# Patient Record
Sex: Male | Born: 1965
Health system: Southern US, Community
[De-identification: ages and names within clinical notes are randomized; demographics above are authoritative.]

## PROBLEM LIST (undated history)

## (undated) HISTORY — PX: APPENDECTOMY: SHX54

---

## 2012-04-05 ENCOUNTER — Ambulatory Visit: Payer: BC Managed Care – PPO

## 2012-04-05 ENCOUNTER — Ambulatory Visit (INDEPENDENT_AMBULATORY_CARE_PROVIDER_SITE_OTHER): Payer: BC Managed Care – PPO | Admitting: Family Medicine

## 2012-04-05 VITALS — BP 109/75 | HR 80 | Temp 100.9°F | Resp 20 | Ht 71.0 in | Wt 283.0 lb

## 2012-04-05 DIAGNOSIS — R0609 Other forms of dyspnea: Secondary | ICD-10-CM

## 2012-04-05 DIAGNOSIS — R05 Cough: Secondary | ICD-10-CM

## 2012-04-05 DIAGNOSIS — R51 Headache: Secondary | ICD-10-CM

## 2012-04-05 DIAGNOSIS — R0600 Dyspnea, unspecified: Secondary | ICD-10-CM

## 2012-04-05 DIAGNOSIS — R059 Cough, unspecified: Secondary | ICD-10-CM

## 2012-04-05 DIAGNOSIS — R509 Fever, unspecified: Secondary | ICD-10-CM

## 2012-04-05 DIAGNOSIS — J189 Pneumonia, unspecified organism: Secondary | ICD-10-CM

## 2012-04-05 LAB — POCT CBC
Granulocyte percent: 66.9 %G (ref 37–80)
HCT, POC: 39.9 % — AB (ref 43.5–53.7)
Hemoglobin: 12.2 g/dL — AB (ref 14.1–18.1)
Lymph, poc: 1.4 (ref 0.6–3.4)
MCH, POC: 27.1 pg (ref 27–31.2)
MCHC: 30.6 g/dL — AB (ref 31.8–35.4)
MCV: 88.5 fL (ref 80–97)
MID (cbc): 0.5 (ref 0–0.9)
MPV: 8.2 fL (ref 0–99.8)
POC Granulocyte: 3.9 (ref 2–6.9)
POC LYMPH PERCENT: 24.8 %L (ref 10–50)
POC MID %: 8.3 %M (ref 0–12)
Platelet Count, POC: 245 10*3/uL (ref 142–424)
RBC: 4.51 M/uL — AB (ref 4.69–6.13)
RDW, POC: 15.9 %
WBC: 5.8 10*3/uL (ref 4.6–10.2)

## 2012-04-05 LAB — POCT INFLUENZA A/B
Influenza A, POC: NEGATIVE
Influenza B, POC: NEGATIVE

## 2012-04-05 MED ORDER — LEVOFLOXACIN 500 MG PO TABS: 500.0000 mg | ORAL_TABLET | Freq: Every day | ORAL | Status: DC

## 2012-04-05 MED ORDER — HYDROCODONE-HOMATROPINE 5-1.5 MG/5ML PO SYRP: 5.0000 mL | ORAL_SOLUTION | Freq: Three times a day (TID) | ORAL | Status: DC | PRN

## 2012-04-05 MED ORDER — CEFTRIAXONE SODIUM 1 G IJ SOLR
1.0000 g | INTRAMUSCULAR | Status: DC
  Administered 2012-04-05: 1 g via INTRAMUSCULAR

## 2012-04-05 NOTE — Progress Notes (Addendum)
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  Objective:  NAD, alert HEENT: normal TM's, fundi, oroph, PERLA, EOMI Neck: supple, no adenop or thyromeg Chest:  Coarse BS bilaterally, no rub Heart: no gallop, murmur, rub Abdomen:  Soft, nontender without mass or HSM Ext: pink nail beds, no splinter hemorrhages, no edema;  Pes planus [facility] reading (PRIMARY) by  Dr. Milus Glazier CXR. Lobar infiltrate Results for orders placed in visit on 04/05/12  POCT CBC      Component Value Range   WBC 5.8  4.6 - 10.2 K/uL   Lymph, poc 1.4  0.6 - 3.4   POC LYMPH PERCENT 24.8  10 - 50 %L   MID (cbc) 0.5  0 - 0.9   POC MID % 8.3  0 - 12 %M   POC Granulocyte 3.9  2 - 6.9   Granulocyte percent 66.9  37 - 80 %G   RBC 4.51 (*) 4.69 - 6.13 M/uL   Hemoglobin 12.2 (*) 14.1 - 18.1 g/dL   HCT, POC 16.1 (*) 09.6 - 53.7 %   MCV 88.5  80 - 97 fL   MCH, POC 27.1  27 - 31.2 pg   MCHC 30.6 (*) 31.8 - 35.4 g/dL   RDW, POC 04.5     Platelet Count, POC 245  142 - 424 K/uL   MPV 8.2  0 - 99.8 fL  POCT INFLUENZA A/B      Component Value Range   Influenza A, POC Negative     Influenza B, POC Negative        Assessment:  1. Pneumonia  cefTRIAXone (ROCEPHIN) injection 1 g, levofloxacin (LEVAQUIN) 500 MG tablet, HYDROcodone-homatropine (HYCODAN) 5-1.5 MG/5ML syrup  2. DOE (dyspnea on exertion)  DG Chest 2 View, POCT CBC  3. Cough  DG Chest 2 View, POCT CBC  4. Fever  POCT CBC, POCT Influenza A/B  5. Headache  POCT CBC, POCT Influenza A/B

## 2012-04-05 NOTE — Patient Instructions (Signed)

## 2012-04-07 ENCOUNTER — Ambulatory Visit (INDEPENDENT_AMBULATORY_CARE_PROVIDER_SITE_OTHER): Payer: BC Managed Care – PPO | Admitting: Family Medicine

## 2012-04-07 VITALS — BP 120/73 | HR 70 | Temp 97.9°F | Resp 18 | Ht 71.0 in | Wt 283.0 lb

## 2012-04-07 DIAGNOSIS — J189 Pneumonia, unspecified organism: Secondary | ICD-10-CM

## 2012-04-07 NOTE — Progress Notes (Signed)
46 year old gentleman who is recently been diagnosed with pneumonia. Since he was here he has done much better with better breathing no chest pain and no fever. He's been taking his medicine as directed and has had no side effects such as dizziness or nausea with the antibiotics.  Objective: Patient looks like 1 million box Chest: Few sonorous expiratory wheezes on the right HEENT unremarkable Assessment: Excellent improvement  Plan continue current medications

## 2013-07-25 IMAGING — CR DG CHEST 2V
3 series · 3 of 3 positions shown · non-contrast
Comparison: Preliminary reading of Dr. Ito

CLINICAL DATA: Cough, shortness of breath

CHEST - 2 VIEW

[PA]
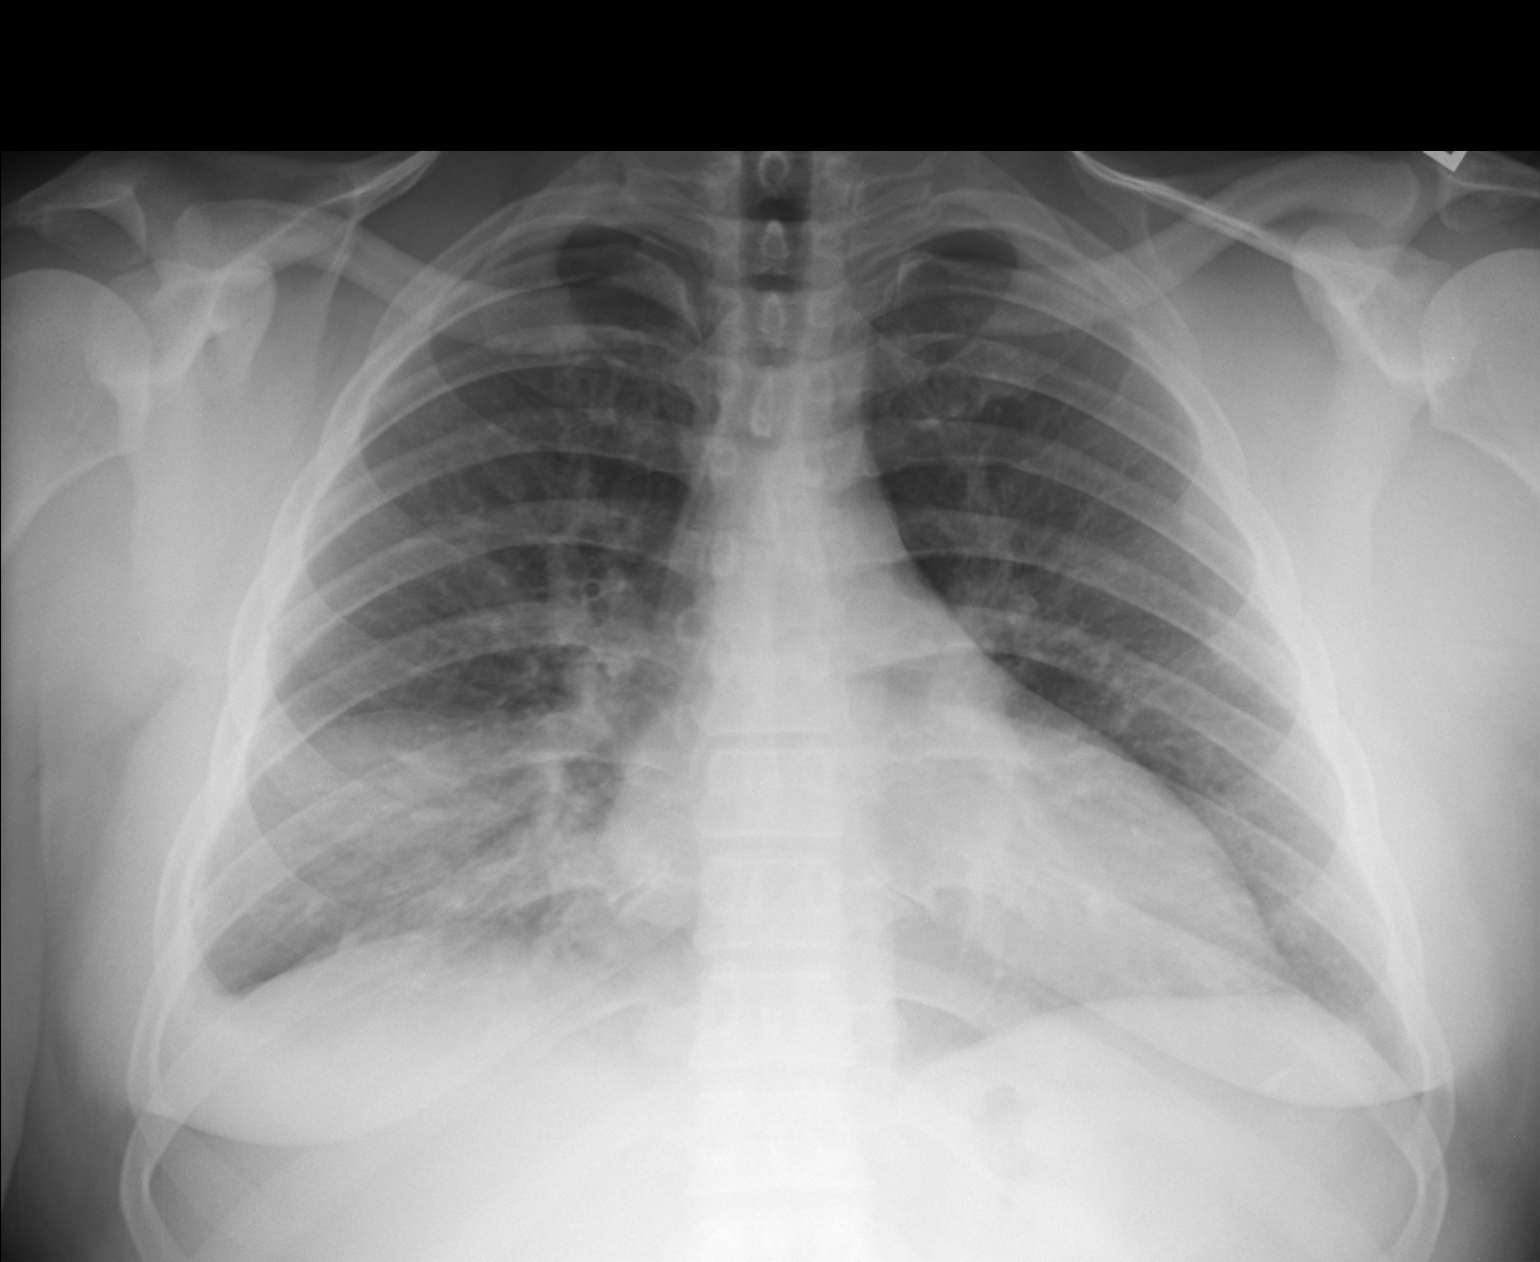

[lateral (1 of 2)]
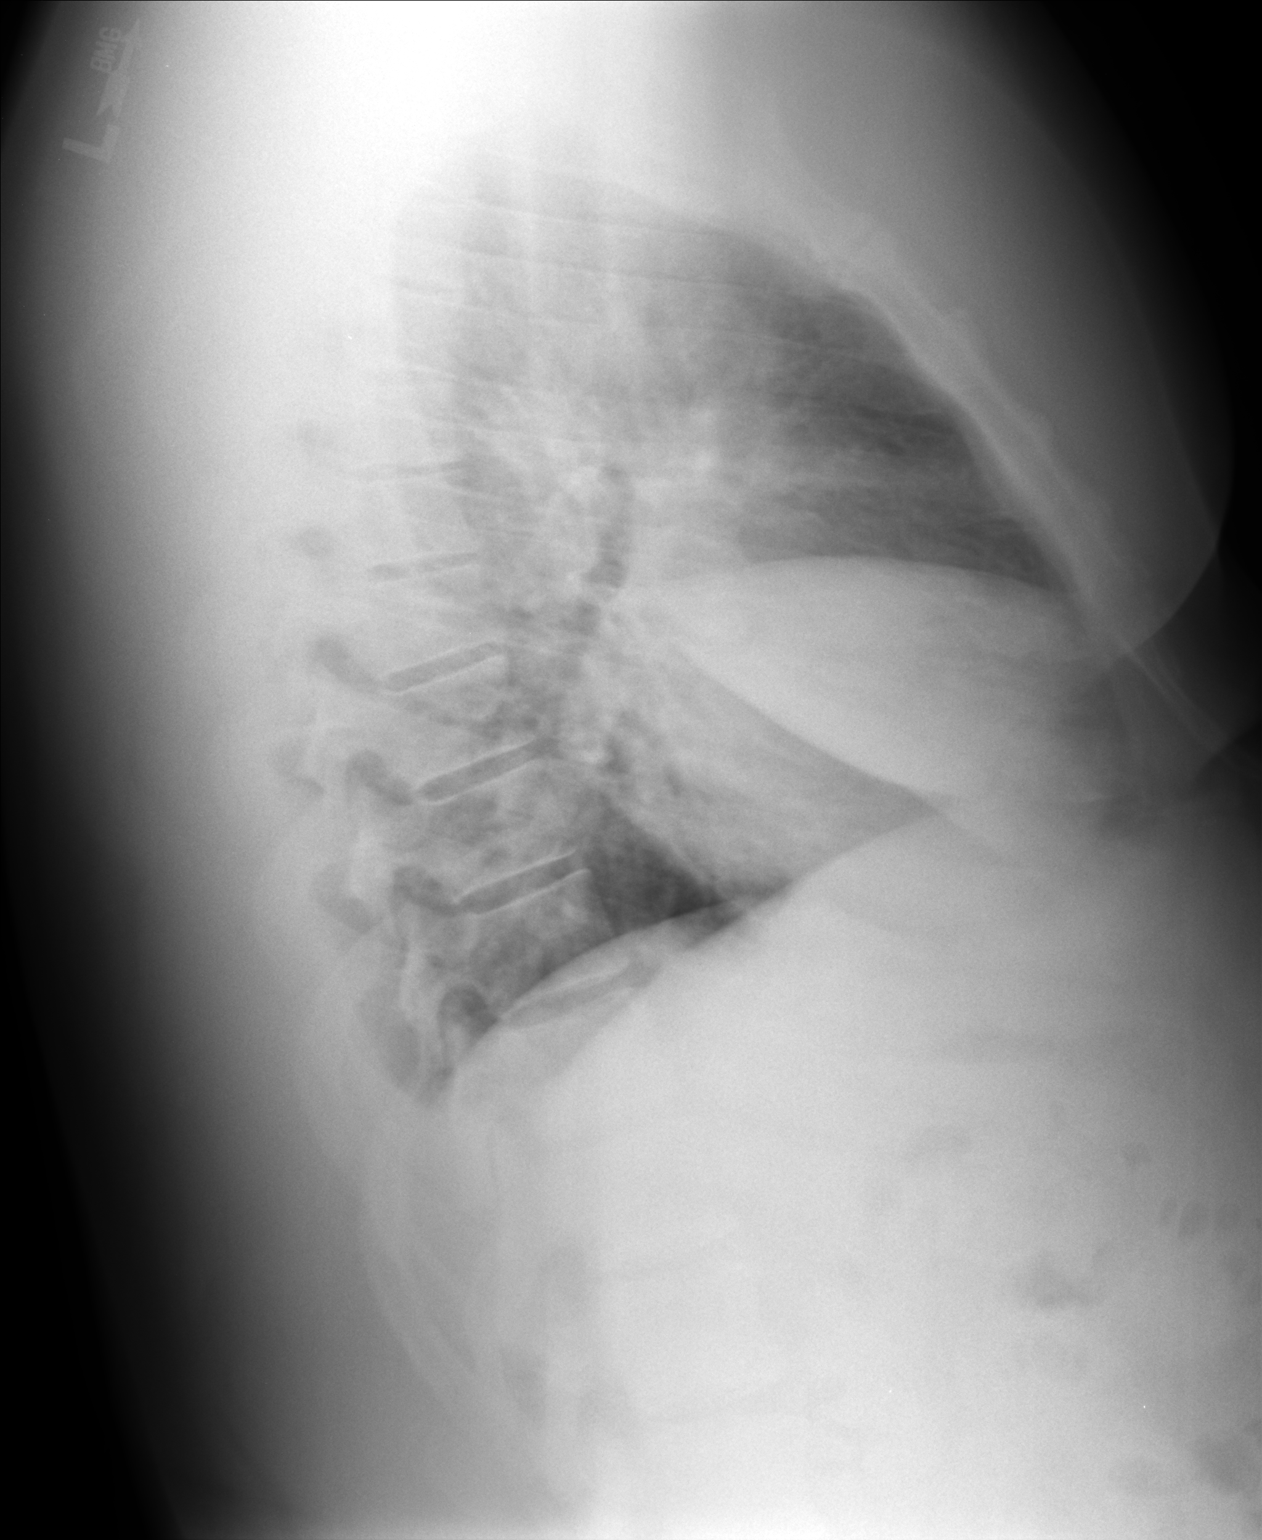

[lateral (2 of 2)]
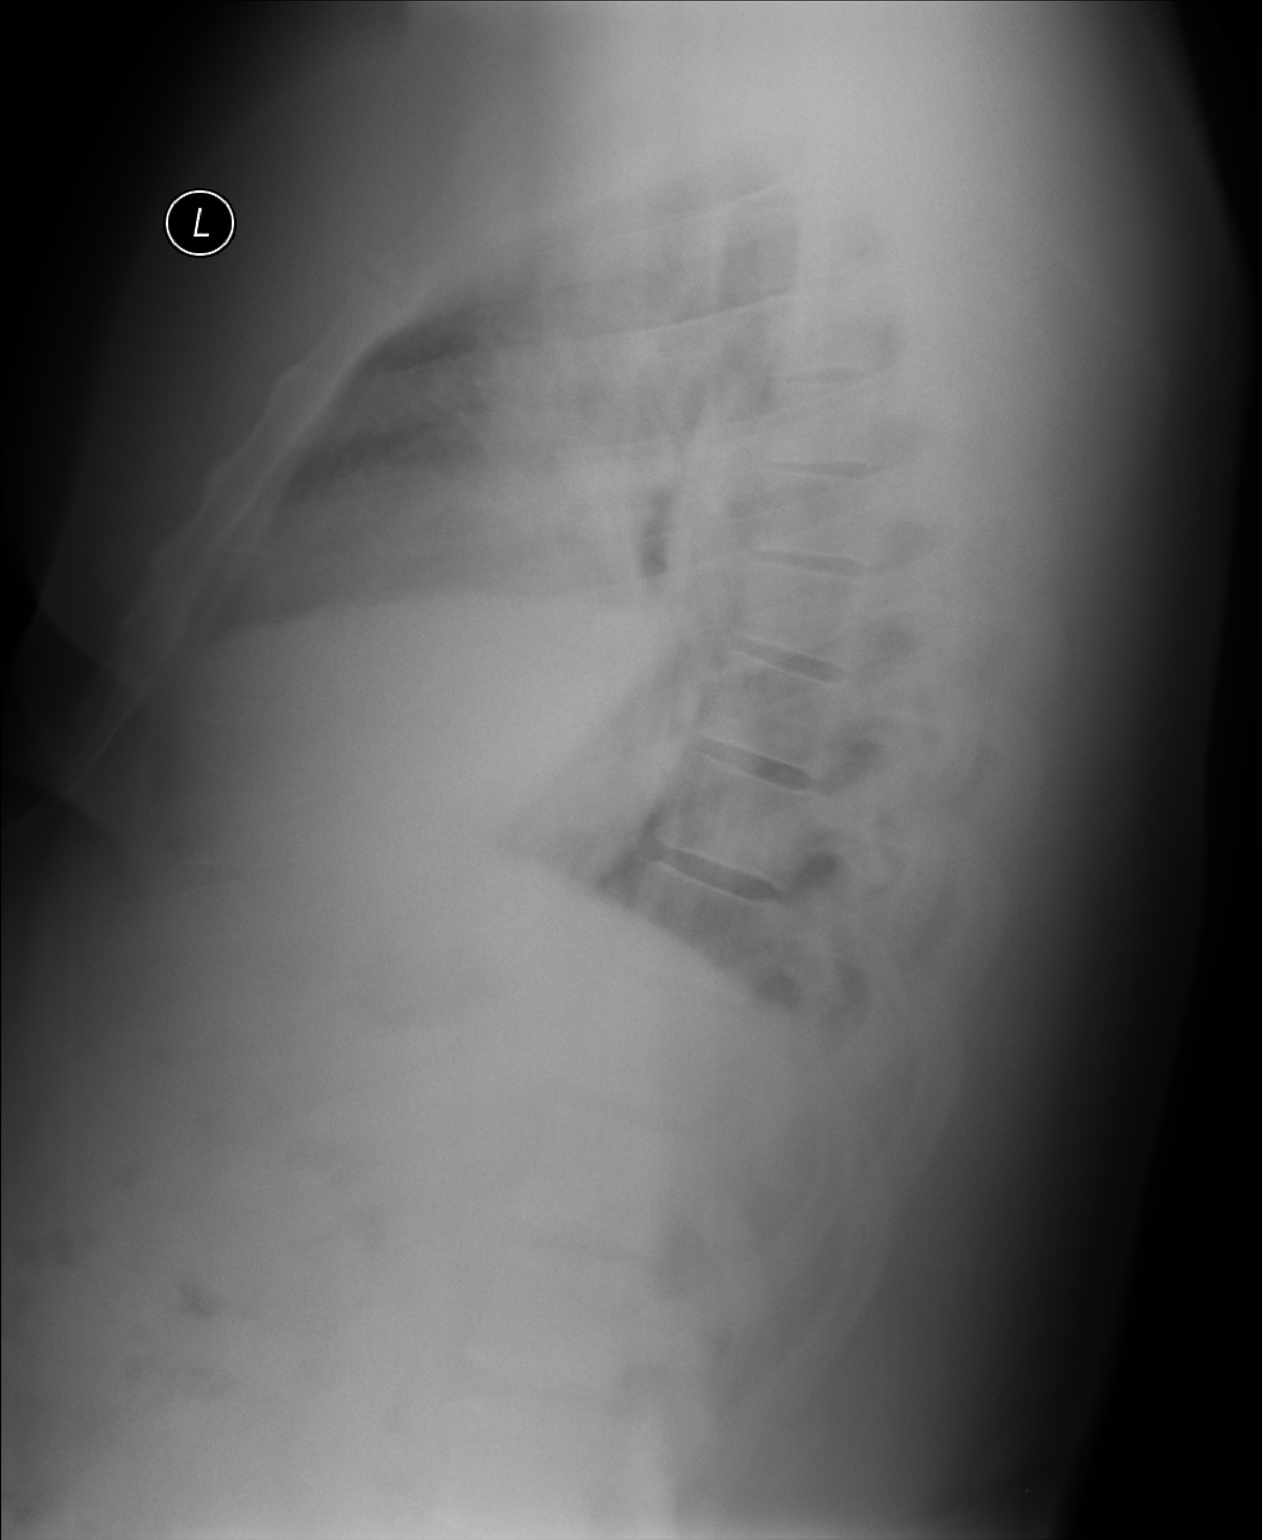

[3 of 3 positions shown; findings below may reference images not displayed]

FINDINGS: Cardiomediastinal silhouette is unremarkable.  There is
hazy infiltrate/pneumonia in the right lower lobe.  Follow-up to
resolution after appropriate treatment is recommended.  Bony thorax
is unremarkable.  No pulmonary edema .
IMPRESSION: Hazy infiltrate/pneumonia in the right lower lobe.  Follow-up to
resolution after treatment is recommended.

## 2015-03-31 ENCOUNTER — Emergency Department
Admission: EM | Admit: 2015-03-31 | Discharge: 2015-03-31 | Disposition: A | Discharge status: Home / Self Care | Payer: Commercial Indemnity | Attending: Emergency Medicine | Admitting: Emergency Medicine

## 2015-03-31 ENCOUNTER — Encounter: Payer: Self-pay | Admitting: Emergency Medicine

## 2015-03-31 DIAGNOSIS — Y9289 Other specified places as the place of occurrence of the external cause: Secondary | ICD-10-CM | POA: Diagnosis not present

## 2015-03-31 DIAGNOSIS — Y998 Other external cause status: Secondary | ICD-10-CM | POA: Diagnosis not present

## 2015-03-31 DIAGNOSIS — S40862A Insect bite (nonvenomous) of left upper arm, initial encounter: Secondary | ICD-10-CM | POA: Diagnosis not present

## 2015-03-31 DIAGNOSIS — W57XXXA Bitten or stung by nonvenomous insect and other nonvenomous arthropods, initial encounter: Secondary | ICD-10-CM | POA: Diagnosis not present

## 2015-03-31 DIAGNOSIS — Z79899 Other long term (current) drug therapy: Secondary | ICD-10-CM | POA: Insufficient documentation

## 2015-03-31 DIAGNOSIS — Y9389 Activity, other specified: Secondary | ICD-10-CM | POA: Insufficient documentation

## 2015-03-31 MED ORDER — DEXAMETHASONE SODIUM PHOSPHATE 10 MG/ML IJ SOLN
10.0000 mg | Freq: Once | INTRAMUSCULAR | Status: AC
  Administered 2015-03-31: 10 mg via INTRAMUSCULAR
  Filled 2015-03-31: qty 1

## 2015-03-31 MED ORDER — CYPROHEPTADINE HCL 4 MG PO TABS: 4.0000 mg | ORAL_TABLET | Freq: Three times a day (TID) | ORAL | Status: DC | PRN

## 2015-03-31 MED ORDER — METHYLPREDNISOLONE 4 MG PO TBPK
ORAL_TABLET | ORAL | Status: DC
Start: 2015-03-31 — End: 2018-02-14

## 2015-03-31 MED ORDER — HYDROXYZINE HCL 50 MG PO TABS
50.0000 mg | ORAL_TABLET | Freq: Once | ORAL | Status: AC
  Administered 2015-03-31: 50 mg via ORAL
  Filled 2015-03-31: qty 1

## 2015-03-31 NOTE — ED Notes (Signed)
Pt. Going home by self. 

## 2015-03-31 NOTE — ED Notes (Signed)
Pt presents to ER with c/o of doing outside work around 11 am and developed small cyst-like insect bites from lefty wrist to left upper warm. Warm to touch, erythematous, and hard.

## 2015-03-31 NOTE — ED Provider Notes (Signed)
[facility] Emergency Department Provider Note  ____________________________________________  Time seen: Approximately 7:45 PM  I have reviewed the triage vital signs and the nursing notes.   HISTORY  Chief Complaint Insect Bite    HPI Samuel Ellis is a 49 y.o. male patient complain of itching and swelling to the to left upper arm secondary to insect bite. 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 Instead occurred approximately 1100 hrs. today. Patient states continue working and after work he washed the area area and noticed to papular lesions at the site of itching. Patient also noticed some mild erythema. Patient denies any fever or chills he denies any nausea vomiting. Patient states there is no pain just itching and mild rash.   History reviewed. No pertinent past medical history.  There are no active problems to display for this patient.   Past Surgical History  Procedure Laterality Date  . Appendectomy      Current Outpatient Rx  Name  Route  Sig  Dispense  Refill  . cyproheptadine (PERIACTIN) 4 MG tablet   Oral   Take 1 tablet (4 mg total) by mouth 3 (three) times daily as needed for allergies.   30 tablet   0   . HYDROcodone-homatropine (HYCODAN) 5-1.5 MG/5ML syrup   Oral   Take 5 mLs by mouth every 8 (eight) hours as needed for cough.   120 mL   0   . levofloxacin (LEVAQUIN) 500 MG tablet   Oral   Take 1 tablet (500 mg total) by mouth daily.   7 tablet   0   . methylPREDNISolone (MEDROL DOSEPAK) 4 MG TBPK tablet      Take Tapered dose as directed   21 tablet   0     Allergies Shellfish allergy  Family History  Problem Relation Age of Onset  . Diabetes Mother   . Hypertension Maternal Grandmother   . Diabetes Maternal Grandfather     Social History Social History  Substance Use Topics  . Smoking status: Never Smoker   . Smokeless tobacco: None  . Alcohol Use: None    Review of  Systems Constitutional: No fever/chills Eyes: No visual changes. ENT: No sore throat. Cardiovascular: Denies chest pain. Respiratory: Denies shortness of breath. Gastrointestinal: No abdominal pain.  No nausea, no vomiting.  No diarrhea.  No constipation. Genitourinary: Negative for dysuria. Musculoskeletal: Negative for back pain. Skin: Negative for rash. Neurological: Negative for headaches, focal weakness or numbness. Allergic/Immunilogical: Shellfish  10-point ROS otherwise negative.  ____________________________________________   PHYSICAL EXAM:  VITAL SIGNS: ED Triage Vitals  Enc Vitals Group     BP 03/31/15 1931 134/76 mmHg     Pulse Rate 03/31/15 1931 79     Resp 03/31/15 1931 20     Temp 03/31/15 1931 98.3 F (36.8 C)     Temp Source 03/31/15 1931 Oral     SpO2 03/31/15 1931 96 %     Weight 03/31/15 1931 296 lb (134.265 kg)     Height 03/31/15 1931  (1.803 m)     Head Cir --      Peak Flow --      Pain Score --      Pain Loc --      Pain Edu? --      Excl. in GC? --     Constitutional: Alert and oriented. Well appearing and in no acute distress. Eyes: Conjunctivae are normal. PERRL. EOMI. Head: Atraumatic. Nose: No  congestion/rhinnorhea. Mouth/Throat: Mucous membranes are moist.  Oropharynx non-erythematous. Neck: No stridor.  No cervical spine tenderness to palpation. Hematological/Lymphatic/Immunilogical: No cervical lymphadenopathy. Cardiovascular: Normal rate, regular rhythm. Grossly normal heart sounds.  Good peripheral circulation. Respiratory: Normal respiratory effort.  No retractions. Lungs CTAB. Gastrointestinal: Soft and nontender. No distention. No abdominal bruits. No CVA tenderness. Musculoskeletal: No lower extremity tenderness nor edema.  No joint effusions. Neurologic:  Normal speech and language. No gross focal neurologic deficits are appreciated. No gait instability. Skin:  Skin is warm, dry and intact. No rash noted. 2 small  papular lesions medial aspect of the left upper arm. Mild edema and erythema. Psychiatric: Mood and affect are normal. Speech and behavior are normal.  ____________________________________________   LABS (all labs ordered are listed, but only abnormal results are displayed)  Labs Reviewed - No data to display ____________________________________________  EKG   ____________________________________________  RADIOLOGY   ____________________________________________   PROCEDURES  Procedure(s) performed: None  Critical Care performed: No  ____________________________________________   INITIAL IMPRESSION / ASSESSMENT AND PLAN / ED COURSE  Pertinent labs & imaging results that were available during my care of the patient were reviewed by me and considered in my medical decision making (see chart for details).  Localized reaction to insect bite. Patient given Decadron and Atarax in the ER. Patient discharged with prescription for prednisone and Periactin. Patient advised [facility] or return to ER if his condition worsens. ____________________________________________   FINAL CLINICAL IMPRESSION(S) / ED DIAGNOSES  Final diagnoses:  Insect bite of left arm, initial encounter      Joni ReiningRonald K Smith, PA-C 03/31/15 1950  Sharman CheekPhillip Stafford, MD 04/01/15 2202

## 2015-03-31 NOTE — Discharge Instructions (Signed)
Insect Bite °Mosquitoes, flies, fleas, bedbugs, and other insects can bite. Insect bites are different from insect stings. The bite may be red, puffy (swollen), and itchy for 2 to 4 days. Most bites get better on their own. °HOME CARE  °· Do not scratch the bite. °· Keep the bite clean and dry. Wash the bite with soap and water every day, as told by your doctor. °· If directed, apply ice to the bite area. °¨ Put ice in a plastic bag. °¨ Place a towel between your skin and the bag. °¨ Leave the ice on for 20 minutes, 2-3 times per day. °· Follow instructions from your doctor about using medicated lotions or creams. These can help with itching. °· Apply or take over-the-counter and prescription medicines only as told by your doctor. °· If you were given an antibiotic medicine, use it as told by your doctor. Do not stop using the medicine even if your condition improves. °· Keep all follow-up visits as told by your doctor. This is important. °GET HELP IF: °· You have redness, swelling (inflammation), or pain near your bite that is getting worse. °· You have a fever. °GET HELP RIGHT AWAY IF:  °· You have joint pain.   °· You have fluid, blood, or pus coming from the bite area.   °· You have a headache. °· You have neck pain. °· You feel weaker than you normally do.   °· You have a rash.   °· You have chest pain. °· You have shortness of breath. °· You have stomach pain, feel sick to your stomach (nauseous), or throw up (vomit). °· You feel more tired or sleepy than you normally do. °  °This information is not intended to replace advice given to you by your health care provider. Make sure you discuss any questions you have with your health care provider. °  °Document Released: 05/14/2000 Document Revised: 02/05/2015 Document Reviewed: 10/02/2014 °Elsevier Interactive Patient Education ©2016 Elsevier Inc. ° °

## 2015-03-31 NOTE — ED Notes (Signed)
Pt reports he felt a burning on the interior aspect of his left arm.  Pt reports the injury 'wept', and as time went on, it became reddened and hotter.  Pt took a benadryl at approximately 17:30. Pt has two small punctures on interior aspect of left arm.  Redness and edema from mid forearm above elbow.

## 2017-06-29 DIAGNOSIS — J069 Acute upper respiratory infection, unspecified: Secondary | ICD-10-CM | POA: Diagnosis not present

## 2017-06-29 DIAGNOSIS — R6889 Other general symptoms and signs: Secondary | ICD-10-CM | POA: Diagnosis not present

## 2017-06-29 DIAGNOSIS — J019 Acute sinusitis, unspecified: Secondary | ICD-10-CM | POA: Diagnosis not present

## 2018-02-14 ENCOUNTER — Ambulatory Visit: Payer: 59 | Admitting: Family Medicine

## 2018-02-14 ENCOUNTER — Other Ambulatory Visit: Payer: Self-pay

## 2018-02-14 ENCOUNTER — Encounter: Payer: Self-pay | Admitting: Family Medicine

## 2018-02-14 VITALS — BP 138/85 | HR 60 | Temp 98.8°F | Ht 71.0 in | Wt 308.4 lb

## 2018-02-14 DIAGNOSIS — J069 Acute upper respiratory infection, unspecified: Secondary | ICD-10-CM

## 2018-02-14 DIAGNOSIS — Z1211 Encounter for screening for malignant neoplasm of colon: Secondary | ICD-10-CM | POA: Diagnosis not present

## 2018-02-14 NOTE — Progress Notes (Signed)
9/17/20192:35 PM  Samuel JacobsonRoderick Bagby [birth year], 52 y.o. male [id number]  Chief Complaint  Patient presents with  . Nasal Congestion    onset Saturday, taking mucinex for the for the symptoms.     HPI:   Patient is a 52 y.o. male who presents today for congestion  4 days of nasal and chest congestion started with runny nose and low grade fever mucinex and night quil, helping with cough, minimal production, slight color to it No more fever, no SOB Daughter and mother had colds, but nothing more than it  Non smoker No h/o asthma or COPD No sign seasonal allergies Has no PCP  No fhx colon cancer screening Flu vaccine with CPE  Fall Risk  02/14/2018  Falls in the past year? No     Depression screen PHQ 2/9 02/14/2018  Decreased Interest 0  Down, Depressed, Hopeless 0  PHQ - 2 Score 0    Allergies  Allergen Reactions  . Shellfish Allergy Anaphylaxis    Throat swells.    Prior to Admission medications   Medication Sig Start Date End Date Taking? Authorizing Provider  cyproheptadine (PERIACTIN) 4 MG tablet Take 1 tablet (4 mg total) by mouth 3 (three) times daily as needed for allergies. 03/31/15   Joni ReiningSmith, Ronald K, PA-C  HYDROcodone-homatropine [facility](HYCODAN) 5-1.5 MG/5ML syrup Take 5 mLs by mouth every 8 (eight) hours as needed for cough. 04/05/12   Elvina SidleLauenstein, Kurt, MD  levofloxacin (LEVAQUIN) 500 MG tablet Take 1 tablet (500 mg total) by mouth daily. 04/05/12   Elvina SidleLauenstein, Kurt, MD  methylPREDNISolone (MEDROL DOSEPAK) 4 MG TBPK tablet Take Tapered dose as directed 03/31/15   Joni ReiningSmith, Ronald K, PA-C    History reviewed. No pertinent past medical history.  Past Surgical History:  Procedure Laterality Date  . APPENDECTOMY      Social History   Tobacco Use  . Smoking status: Never Smoker  . Smokeless tobacco: Never Used  Substance Use Topics  . Alcohol use: Yes    Family History  Problem Relation Age of Onset  . Diabetes Mother   . Hypertension Maternal Grandmother    . Diabetes Maternal Grandfather     Review of Systems  Constitutional: Positive for fever. Negative for chills and malaise/fatigue.  Respiratory: Positive for cough and sputum production. Negative for hemoptysis, shortness of breath and wheezing.   Cardiovascular: Negative for chest pain, palpitations and leg swelling.  Gastrointestinal: Negative for abdominal pain, nausea and vomiting.     OBJECTIVE:  Blood pressure 138/85, pulse 60, temperature 98.8 F (37.1 C), temperature source Oral, height 5\' 11"  (1.803 m), weight (!) 308 lb 6.4 oz (139.9 kg), SpO2 98 %. Body mass index is 43.01 kg/m.   Physical Exam  Constitutional: He is oriented to person, place, and time. He appears well-developed and well-nourished.  HENT:  Head: Normocephalic and atraumatic.  Right Ear: Hearing, tympanic membrane, external ear and ear canal normal.  Left Ear: Hearing, tympanic membrane, external ear and ear canal normal.  Mouth/Throat: Oropharynx is clear and moist. No oropharyngeal exudate.  Eyes: Pupils are equal, round, and reactive to light. Conjunctivae and EOM are normal.  Neck: Neck supple.  Cardiovascular: Normal rate and regular rhythm. Exam reveals no gallop and no friction rub.  No murmur heard. Pulmonary/Chest: Effort normal and breath sounds normal. He has no wheezes. He has no rales.  Musculoskeletal: He exhibits no edema.  Lymphadenopathy:    He has no cervical adenopathy.  Neurological: He is alert and oriented to person,  place, and time.  Skin: Skin is warm and dry.  Psychiatric: He has a normal mood and affect.  Nursing note and vitals reviewed.   ASSESSMENT and PLAN  1. URI, acute Discussed supportive measures for URI: increase hydration, rest, OTC medications, etc. RTC precautions discussed.  2. Colon cancer screening - Cologuard  Return for CPE.    Myles Lipps, MD Primary Care at [facility] [address] [place], [place] [address] Ph.  [phone] Fax  [phone]

## 2018-02-14 NOTE — Patient Instructions (Addendum)
   If you have lab work done today you will be contacted with your lab results within the next 2 weeks.  If you have not heard from us then please contact us. The fastest way to get your results is to register for My Chart.   IF you received an x-ray today, you will receive an invoice from [facility]. Please contact [facility] at [phone] with questions or concerns regarding your invoice.   IF you received labwork today, you will receive an invoice from [facility]. Please contact [facility] at [phone] with questions or concerns regarding your invoice.   Our billing staff will not be able to assist you with questions regarding bills from these companies.  You will be contacted with the lab results as soon as they are available. The fastest way to get your results is to activate your My Chart account. Instructions are located on the last page of this paperwork. If you have not heard from us regarding the results in 2 weeks, please contact this office.     Upper Respiratory Infection, Adult Most upper respiratory infections (URIs) are caused by a virus. A URI affects the nose, throat, and upper air passages. The most common type of URI is often called "the common cold." Follow these instructions at home:  Take medicines only as told by your doctor.  Gargle warm saltwater or take cough drops to comfort your throat as told by your doctor.  Use a warm mist humidifier or inhale steam from a shower to increase air moisture. This may make it easier to breathe.  Drink enough fluid to keep your pee (urine) clear or pale yellow.  Eat soups and other clear broths.  Have a healthy diet.  Rest as needed.  Go back to work when your fever is gone or your doctor says it is okay. ? You may need to stay home longer to avoid giving your URI to others. ? You can also wear a face mask and wash your hands often to prevent spread of the virus.  Use your inhaler more if you  have asthma.  Do not use any tobacco products, including cigarettes, chewing tobacco, or electronic cigarettes. If you need help quitting, ask your doctor. Contact a doctor if:  You are getting worse, not better.  Your symptoms are not helped by medicine.  You have chills.  You are getting more short of breath.  You have brown or red mucus.  You have yellow or brown discharge from your nose.  You have pain in your face, especially when you bend forward.  You have a fever.  You have puffy (swollen) neck glands.  You have pain while swallowing.  You have white areas in the back of your throat. Get help right away if:  You have very bad or constant: ? Headache. ? Ear pain. ? Pain in your forehead, behind your eyes, and over your cheekbones (sinus pain). ? Chest pain.  You have long-lasting (chronic) lung disease and any of the following: ? Wheezing. ? Long-lasting cough. ? Coughing up blood. ? A change in your usual mucus.  You have a stiff neck.  You have changes in your: ? Vision. ? Hearing. ? Thinking. ? Mood. This information is not intended to replace advice given to you by your health care provider. Make sure you discuss any questions you have with your health care provider. Document Released: 11/03/2007 Document Revised: 01/18/2016 Document Reviewed: 08/22/2013 Elsevier Interactive Patient Education  2018 Elsevier   Inc.  

## 2018-03-14 ENCOUNTER — Encounter: Payer: Self-pay | Admitting: Family Medicine

## 2018-03-14 ENCOUNTER — Other Ambulatory Visit: Payer: Self-pay

## 2018-03-14 ENCOUNTER — Ambulatory Visit (INDEPENDENT_AMBULATORY_CARE_PROVIDER_SITE_OTHER): Payer: 59 | Admitting: Family Medicine

## 2018-03-14 VITALS — BP 122/84 | HR 60 | Temp 97.3°F | Ht 71.0 in | Wt 309.4 lb

## 2018-03-14 DIAGNOSIS — Z13228 Encounter for screening for other metabolic disorders: Secondary | ICD-10-CM

## 2018-03-14 DIAGNOSIS — Z125 Encounter for screening for malignant neoplasm of prostate: Secondary | ICD-10-CM

## 2018-03-14 DIAGNOSIS — Z0000 Encounter for general adult medical examination without abnormal findings: Secondary | ICD-10-CM | POA: Diagnosis not present

## 2018-03-14 DIAGNOSIS — Z13220 Encounter for screening for lipoid disorders: Secondary | ICD-10-CM

## 2018-03-14 DIAGNOSIS — Z23 Encounter for immunization: Secondary | ICD-10-CM

## 2018-03-14 DIAGNOSIS — Z1211 Encounter for screening for malignant neoplasm of colon: Secondary | ICD-10-CM | POA: Diagnosis not present

## 2018-03-14 DIAGNOSIS — Z1329 Encounter for screening for other suspected endocrine disorder: Secondary | ICD-10-CM | POA: Diagnosis not present

## 2018-03-14 DIAGNOSIS — Z130 Encounter for screening for diseases of the blood and blood-forming organs and certain disorders involving the immune mechanism: Secondary | ICD-10-CM

## 2018-03-14 NOTE — Patient Instructions (Addendum)
If you have lab work done today you will be contacted with your lab results within the next 2 weeks.  If you have not heard from [place] then please contact us. The fastest way to get your results is to register for My Chart.   IF you received an x-ray today, you will receive an invoice from [facility]. Please contact [facility] at [phone] with questions or concerns regarding your invoice.   IF you received labwork today, you will receive an invoice from [place]. Please contact [facility] at [phone] with questions or concerns regarding your invoice.   Our billing staff will not be able to assist you with questions regarding bills from these companies.  You will be contacted with the lab results as soon as they are available. The fastest way to get your results is to activate your My Chart account. Instructions are located on the last page of this paperwork. If you have not heard from [place] regarding the results in 2 weeks, please contact this office.        If you have lab work done today you will be contacted with your lab results within the next 2 weeks.  If you have not heard from [place] then please contact us. The fastest way to get your results is to register for My Chart.   IF you received an x-ray today, you will receive an invoice from [facility]. Please contact [facility] at [phone] with questions or concerns regarding your invoice.   IF you received labwork today, you will receive an invoice from [place]. Please contact [facility] at [phone] with questions or concerns regarding your invoice.   Our billing staff will not be able to assist you with questions regarding bills from these companies.  You will be contacted with the lab results as soon as they are available. The fastest way to get your results is to activate your My Chart account. Instructions are located on the last page of this paperwork. If you have not heard from [place]  regarding the results in 2 weeks, please contact this office.     Preventive Care 40-64 Years, Male Preventive care refers to lifestyle choices and visits with your health care provider that can promote health and wellness. What does preventive care include?  A yearly physical exam. This is also called an annual well check.  Dental exams once or twice a year.  Routine eye exams. Ask your health care provider how often you should have your eyes checked.  Personal lifestyle choices, including: ? Daily care of your teeth and gums. ? Regular physical activity. ? Eating a healthy diet. ? Avoiding tobacco and drug use. ? Limiting alcohol use. ? Practicing safe sex. ? Taking low-dose aspirin every day starting at age 9. What happens during an annual well check? The services and screenings done by your health care provider during your annual well check will depend on your age, overall health, lifestyle risk factors, and family history of disease. Counseling Your health care provider may ask you questions about your:  Alcohol use.  Tobacco use.  Drug use.  Emotional well-being.  Home and relationship well-being.  Sexual activity.  Eating habits.  Work and work Statistician.  Screening You may have the following tests or measurements:  Height, weight, and BMI.  Blood pressure.  Lipid and cholesterol levels. These may be checked every 5 years, or more frequently if you are over 30 years old.  Skin check.  Lung cancer  screening. You may have this screening every year starting at age 96 if you have a 30-pack-year history of smoking and currently smoke or have quit within the past 15 years.  Fecal occult blood test (FOBT) of the stool. You may have this test every year starting at age 59.  Flexible sigmoidoscopy or colonoscopy. You may have a sigmoidoscopy every 5 years or a colonoscopy every 10 years starting at age 67.  Prostate cancer screening. Recommendations will  vary depending on your family history and other risks.  Hepatitis C blood test.  Hepatitis B blood test.  Sexually transmitted disease (STD) testing.  Diabetes screening. This is done by checking your blood sugar (glucose) after you have not eaten for a while (fasting). You may have this done every 1-3 years.  Discuss your test results, treatment options, and if necessary, the need for more tests with your health care provider. Vaccines Your health care provider may recommend certain vaccines, such as:  Influenza vaccine. This is recommended every year.  Tetanus, diphtheria, and acellular pertussis (Tdap, Td) vaccine. You may need a Td booster every 10 years.  Varicella vaccine. You may need this if you have not been vaccinated.  Zoster vaccine. You may need this after age 21.  Measles, mumps, and rubella (MMR) vaccine. You may need at least one dose of MMR if you were born in 1957 or later. You may also need a second dose.  Pneumococcal 13-valent conjugate (PCV13) vaccine. You may need this if you have certain conditions and have not been vaccinated.  Pneumococcal polysaccharide (PPSV23) vaccine. You may need one or two doses if you smoke cigarettes or if you have certain conditions.  Meningococcal vaccine. You may need this if you have certain conditions.  Hepatitis A vaccine. You may need this if you have certain conditions or if you travel or work in places where you may be exposed to hepatitis A.  Hepatitis B vaccine. You may need this if you have certain conditions or if you travel or work in places where you may be exposed to hepatitis B.  Haemophilus influenzae type b (Hib) vaccine. You may need this if you have certain risk factors.  Talk to your health care provider about which screenings and vaccines you need and how often you need them. This information is not intended to replace advice given to you by your health care provider. Make sure you discuss any questions you  have with your health care provider. Document Released: 06/13/2015 Document Revised: 02/04/2016 Document Reviewed: 03/18/2015 Elsevier Interactive Patient Education  [employer].

## 2018-03-14 NOTE — Progress Notes (Signed)
10/15/20199:31 AM  Samuel Ellis [birth year], 51 y.o. male [id number]  Chief Complaint  Patient presents with  . Annual Exam    HPI:   Patient is a 52 y.o. male who presents today for CPE  Has not had CPE in years He is fasting today Colorectal Cancer Screening: has received cologuard Prostate Cancer Screening: today HIV Screening: declines Seasonal Influenza Vaccination:  Td/Tdap Vaccination: declines Pneumococcal Vaccination: at age 89 Zoster Vaccination: declines Frequency of Dental evaluation: Q6 months Frequency of Eye evaluation: yearly  Exercises about 3-5 x week Diet can be improved Had lost about 70lbs in the past 2 years  Married Works for [employer] services  Fall Risk  03/14/2018 02/14/2018  Falls in the past year? No No     Depression screen [facility] 2/9 03/14/2018 02/14/2018  Decreased Interest 0 0  Down, Depressed, Hopeless 0 0  PHQ - 2 Score 0 0    Allergies  Allergen Reactions  . Shellfish Allergy Anaphylaxis    Throat swells.    Prior to Admission medications   Not on File    History reviewed. No pertinent past medical history.  Past Surgical History:  Procedure Laterality Date  . APPENDECTOMY      Social History   Tobacco Use  . Smoking status: Never Smoker  . Smokeless tobacco: Never Used  Substance Use Topics  . Alcohol use: Yes    Family History  Problem Relation Age of Onset  . Diabetes Mother   . Hypertension Maternal Grandmother   . Diabetes Maternal Grandfather     Review of Systems  Constitutional: Negative for chills, fever and malaise/fatigue.  HENT: Negative for hearing loss and tinnitus.   Eyes: Negative for blurred vision and double vision.  Respiratory: Negative for cough and shortness of breath.   Cardiovascular: Negative for chest pain, palpitations and leg swelling.  Gastrointestinal: Negative for abdominal pain, nausea and vomiting.  Genitourinary: Negative for frequency and urgency.  Musculoskeletal:  Negative for joint pain and myalgias.  Neurological: Negative for dizziness, tingling and headaches.  Endo/Heme/Allergies: Negative for polydipsia.  Psychiatric/Behavioral: Negative for depression. The patient is not nervous/anxious and does not have insomnia.   All other systems reviewed and are negative.    OBJECTIVE:  Blood pressure 122/84, pulse 60, temperature (!) 97.3 F (36.3 C), temperature source Oral, height '5\' 11"'  (1.803 m), weight (!) 309 lb 6.4 oz (140.3 kg), SpO2 97 %. Body mass index is 43.15 kg/m.    Visual Acuity Screening   Right eye Left eye Both eyes  Without correction: '20/25 20/25 20/25 '  With correction:       BP Readings from Last 3 Encounters:  03/14/18 122/84  02/14/18 138/85  03/31/15 130/72   Wt Readings from Last 3 Encounters:  03/14/18 (!) 309 lb 6.4 oz (140.3 kg)  02/14/18 (!) 308 lb 6.4 oz (139.9 kg)  03/31/15 296 lb (134.3 kg)    Physical Exam  Constitutional: He is oriented to person, place, and time. He appears well-developed and well-nourished.  HENT:  Head: Normocephalic and atraumatic.  Right Ear: Hearing, tympanic membrane, external ear and ear canal normal.  Left Ear: Hearing, tympanic membrane, external ear and ear canal normal.  Mouth/Throat: Oropharynx is clear and moist. No oropharyngeal exudate.  Eyes: Pupils are equal, round, and reactive to light. Conjunctivae and EOM are normal.  Neck: Neck supple. No thyromegaly present.  Cardiovascular: Normal rate, regular rhythm, normal heart sounds and intact distal pulses. Exam reveals no gallop and no friction  rub.  No murmur heard. Pulmonary/Chest: Effort normal and breath sounds normal. He has no wheezes. He has no rhonchi. He has no rales.  Abdominal: Soft. Bowel sounds are normal. He exhibits no distension and no mass. There is no tenderness.  Musculoskeletal: Normal range of motion. He exhibits no edema.  Lymphadenopathy:    He has no cervical adenopathy.  Neurological: He is  alert and oriented to person, place, and time. He has normal strength and normal reflexes. No cranial nerve deficit. Coordination and gait normal.  Skin: Skin is warm and dry.  Psychiatric: He has a normal mood and affect.  Nursing note and vitals reviewed.   ASSESSMENT and PLAN 1. Annual physical exam Routine HCM labs ordered. HCM reviewed/discussed. Anticipatory guidance regarding healthy weight, lifestyle and choices given.  - Care order/instruction:  2. Screening for lipid disorders - Lipid panel  3. Screening for metabolic disorder - [id number]  4. Screening for thyroid disorder - TSH  5. Screening for deficiency anemia - CBC with Differential/Platelet  6. Screening for prostate cancer - PSA  Other orders - Flu Vaccine QUAD 36+ mos IM - Td vaccine greater than or equal to 7yo preservative free IM  Return in about 1 year (around 03/15/2019) for CPE.    Rutherford Guys, MD [facility] [place], [place] [address] Ph.  [phone] Fax [phone]

## 2018-03-15 LAB — PSA: Prostate Specific Ag, Serum: 0.9 ng/mL (ref 0.0–4.0)

## 2018-03-15 LAB — LIPID PANEL
Chol/HDL Ratio: 3.6 ratio (ref 0.0–5.0)
Cholesterol, Total: 206 mg/dL — ABNORMAL HIGH (ref 100–199)
HDL: 58 mg/dL (ref >39)
LDL Calculated: 126 mg/dL — ABNORMAL HIGH (ref 0–99)
Triglycerides: 111 mg/dL (ref 0–149)
VLDL Cholesterol Cal: 22 mg/dL (ref 5–40)

## 2018-03-15 LAB — CBC WITH DIFFERENTIAL/PLATELET
Basophils Absolute: 0 10*3/uL (ref 0.0–0.2)
Basos: 1 %
EOS (ABSOLUTE): 0.1 10*3/uL (ref 0.0–0.4)
Eos: 1 %
Hematocrit: 41.5 % (ref 37.5–51.0)
Hemoglobin: 13.4 g/dL (ref 13.0–17.7)
Immature Grans (Abs): 0 10*3/uL (ref 0.0–0.1)
Immature Granulocytes: 0 %
Lymphocytes Absolute: 1.6 10*3/uL (ref 0.7–3.1)
Lymphs: 27 %
MCH: 27.4 pg (ref 26.6–33.0)
MCHC: 32.3 g/dL (ref 31.5–35.7)
MCV: 85 fL (ref 79–97)
Monocytes Absolute: 0.5 10*3/uL (ref 0.1–0.9)
Monocytes: 8 %
Neutrophils Absolute: 3.8 10*3/uL (ref 1.4–7.0)
Neutrophils: 63 %
Platelets: 278 10*3/uL (ref 150–450)
RBC: 4.89 x10E6/uL (ref 4.14–5.80)
RDW: 14.5 % (ref 12.3–15.4)
WBC: 6 10*3/uL (ref 3.4–10.8)

## 2018-03-15 LAB — CMP14+EGFR
ALT: 19 IU/L (ref 0–44)
AST: 18 IU/L (ref 0–40)
Albumin/Globulin Ratio: 1.4 (ref 1.2–2.2)
Albumin: 4 g/dL (ref 3.5–5.5)
Alkaline Phosphatase: 46 IU/L (ref 39–117)
BUN/Creatinine Ratio: 14 (ref 9–20)
BUN: 20 mg/dL (ref 6–24)
Bilirubin Total: 0.3 mg/dL (ref 0.0–1.2)
CO2: 23 mmol/L (ref 20–29)
Calcium: 9.2 mg/dL (ref 8.7–10.2)
Chloride: 102 mmol/L (ref 96–106)
Creatinine, Ser: 1.38 mg/dL — ABNORMAL HIGH (ref 0.76–1.27)
GFR calc Af Amer: 67 mL/min/{1.73_m2} (ref >59)
GFR calc non Af Amer: 58 mL/min/{1.73_m2} — ABNORMAL LOW (ref >59)
Globulin, Total: 2.9 g/dL (ref 1.5–4.5)
Glucose: 99 mg/dL (ref 65–99)
Potassium: 4.5 mmol/L (ref 3.5–5.2)
Sodium: 139 mmol/L (ref 134–144)
Total Protein: 6.9 g/dL (ref 6.0–8.5)

## 2018-03-15 LAB — TSH: TSH: 2.22 u[IU]/mL (ref 0.450–4.500)

## 2019-03-22 ENCOUNTER — Encounter: Payer: 59 | Admitting: Family Medicine

## 2019-03-23 ENCOUNTER — Encounter: Payer: Self-pay | Admitting: Family Medicine

## 2019-05-14 ENCOUNTER — Other Ambulatory Visit: Payer: Self-pay

## 2019-05-14 DIAGNOSIS — Z20822 Contact with and (suspected) exposure to covid-19: Secondary | ICD-10-CM

## 2019-05-15 LAB — NOVEL CORONAVIRUS, NAA: SARS-CoV-2, NAA: NOT DETECTED

## 2019-09-03 ENCOUNTER — Ambulatory Visit: Payer: Commercial Indemnity | Attending: Internal Medicine

## 2019-09-03 DIAGNOSIS — Z23 Encounter for immunization: Secondary | ICD-10-CM

## 2019-09-03 NOTE — Progress Notes (Signed)
   Covid-19 Vaccination Clinic  Name:  Christianjames Soule    MRN: [id number] DOB: 20-Dec-1965  09/03/2019  Mr. Bores was observed post Covid-19 immunization for 15 minutes without incident. He was provided with Vaccine Information Sheet and instruction to access the V-Safe system.   Mr. Shein was instructed to call 911 with any severe reactions post vaccine: Marland Kitchen Difficulty breathing  . Swelling of face and throat  . A fast heartbeat  . A bad rash all over body  . Dizziness and weakness   Immunizations Administered    Name Date Dose VIS Date Route   Pfizer COVID-19 Vaccine 09/03/2019  8:14 AM 0.3 mL 05/11/2019 Intramuscular   Manufacturer: [employer], [employer]   Lot: [id number]   NDC: [address]-8

## 2019-09-26 ENCOUNTER — Ambulatory Visit: Payer: Commercial Indemnity

## 2019-10-02 ENCOUNTER — Ambulatory Visit: Payer: Commercial Indemnity

## 2019-10-03 ENCOUNTER — Other Ambulatory Visit: Payer: Self-pay

## 2019-10-03 ENCOUNTER — Ambulatory Visit: Payer: Commercial Indemnity | Attending: Internal Medicine

## 2019-10-03 DIAGNOSIS — Z23 Encounter for immunization: Secondary | ICD-10-CM

## 2019-10-03 NOTE — Progress Notes (Signed)
   Covid-19 Vaccination Clinic  Name:  Ermin Parisien    MRN: [id number] DOB: [birth year]  10/03/2019  Mr. Fielding was observed post Covid-19 immunization for 15 minutes without incident. He was provided with Vaccine Information Sheet and instruction to access the V-Safe system.   Mr. Cinquemani was instructed to call 911 with any severe reactions post vaccine: Marland Kitchen Difficulty breathing  . Swelling of face and throat  . A fast heartbeat  . A bad rash all over body  . Dizziness and weakness   Immunizations Administered    Name Date Dose VIS Date Route   Pfizer COVID-19 Vaccine 10/03/2019 10:53 AM 0.3 mL 07/25/2018 Intramuscular   Manufacturer: [employer], [employer]   Lot: [id number]   NDC: [address]-3
